# Patient Record
Sex: Male | Born: 1995 | Race: Black or African American | Hispanic: No | Marital: Single | Smoking: Current every day smoker
Health system: Southern US, Community
[De-identification: ages and names within clinical notes are randomized; demographics above are authoritative.]

## PROBLEM LIST (undated history)

## (undated) DIAGNOSIS — K4090 Unilateral inguinal hernia, without obstruction or gangrene, not specified as recurrent: Secondary | ICD-10-CM

## (undated) HISTORY — PX: MANDIBLE SURGERY: SHX707

---
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
## 2014-10-16 ENCOUNTER — Emergency Department (HOSPITAL_BASED_OUTPATIENT_CLINIC_OR_DEPARTMENT_OTHER)
Admission: EM | Admit: 2014-10-16 | Discharge: 2014-10-16 | Disposition: A | Discharge status: Home / Self Care | Payer: Medicaid Other | Attending: Emergency Medicine | Admitting: Emergency Medicine

## 2014-10-16 ENCOUNTER — Encounter (HOSPITAL_BASED_OUTPATIENT_CLINIC_OR_DEPARTMENT_OTHER): Payer: Self-pay

## 2014-10-16 DIAGNOSIS — Z720 Tobacco use: Secondary | ICD-10-CM | POA: Insufficient documentation

## 2014-10-16 DIAGNOSIS — R1909 Other intra-abdominal and pelvic swelling, mass and lump: Secondary | ICD-10-CM | POA: Diagnosis present

## 2014-10-16 DIAGNOSIS — K4090 Unilateral inguinal hernia, without obstruction or gangrene, not specified as recurrent: Secondary | ICD-10-CM | POA: Insufficient documentation

## 2014-10-16 HISTORY — DX: Unilateral inguinal hernia, without obstruction or gangrene, not specified as recurrent: K40.90

## 2014-10-16 MED ORDER — NAPROXEN 500 MG PO TABS: 500.0000 mg | ORAL_TABLET | Freq: Two times a day (BID) | ORAL | Status: DC

## 2014-10-16 NOTE — Discharge Instructions (Signed)

## 2014-10-16 NOTE — ED Provider Notes (Signed)
CSN: [id number]     Arrival date & time 10/16/14  1200 History   First MD Initiated Contact with Patient 10/16/14 1522     Chief Complaint  Patient presents with  . Hernia    HPI Patient presents to the emergency room complaining of left groin swelling. Patient has had what he believes to be a hernia in his left inguinal region for some period of time. He noticed yesterday though after work he was having increasing pain and swelling. Patient does notice that when he lifts things the swelling and discomfort increases. He denies any trouble with nausea or vomiting. Normal bowel movements. No trouble with appetite. No fever.  After my exam the patient's girlfriend also asked about a small rash that she noticed the proximal shaft of the patient's penis. She noticed it recently. Patient states it is not painful. It itches somewhat Past Medical History  Diagnosis Date  . Hernia, inguinal    Past Surgical History  Procedure Laterality Date  . Mandible surgery     Family History  Problem Relation Age of Onset  . Sudden death Neg Hx   . Heart attack Neg Hx    History  Substance Use Topics  . Smoking status: Current Every Day Smoker  . Smokeless tobacco: Not on file  . Alcohol Use: No    Review of Systems  All other systems reviewed and are negative.     Allergies  Review of patient's allergies indicates no known allergies.  Home Medications   Prior to Admission medications   Not on File   BP 117/68 mmHg  Pulse 67  Temp(Src) 98.7 F (37.1 C) (Oral)  Resp 18  Ht 5\' 10"  (1.778 m)  Wt 150 lb (68.04 kg)  BMI 21.52 kg/m2  SpO2 100% Physical Exam  Constitutional: He appears well-developed and well-nourished. No distress.  HENT:  Head: Normocephalic and atraumatic.  Right Ear: External ear normal.  Left Ear: External ear normal.  Eyes: Conjunctivae are normal. Right eye exhibits no discharge. Left eye exhibits no discharge. No scleral icterus.  Neck: Neck supple. No  tracheal deviation present.  Cardiovascular: Normal rate, regular rhythm and intact distal pulses.   Pulmonary/Chest: Effort normal and breath sounds normal. No stridor. No respiratory distress. He has no wheezes. He has no rales.  Abdominal: Soft. Bowel sounds are normal. He exhibits no distension. There is no tenderness. There is no rebound and no guarding. A hernia is present. Hernia confirmed positive in the left inguinal area. Hernia confirmed negative in the right inguinal area.  Genitourinary:    Right testis shows no swelling and no tenderness. Left testis shows no swelling and no tenderness.  Musculoskeletal: He exhibits no edema or tenderness.  Neurological: He is alert. He has normal strength. No cranial nerve deficit (no facial droop, extraocular movements intact, no slurred speech) or sensory deficit. He exhibits normal muscle tone. He displays no seizure activity. Coordination normal.  Skin: Skin is warm and dry. No rash noted.  Psychiatric: He has a normal mood and affect.  Nursing note and vitals reviewed.   ED Course  Procedures (including critical care time) Labs Review Labs Reviewed  HERPES SIMPLEX VIRUS CULTURE    Imaging Review No results found.   EKG Interpretation None      MDM   Final diagnoses:  Unilateral inguinal hernia without obstruction or gangrene, recurrence not specified   Patient has an inguinal hernia. He does not have any evidence of incarceration or strangulation. I  discussed my findings with him and I recommended consultation with a general surgeon. We discussed warning signs and precautions.  The incidental rash finding could be consistent with a herpes-type outbreak. Does not appear definitive for that to me so I will send off a culture for further analysis.    Linwood DibblesJon Saige Busby, MD 10/16/14 [phone]

## 2014-10-16 NOTE — ED Notes (Signed)
C/o left groin hernia-swelling noted since last night

## 2014-10-20 LAB — HERPES SIMPLEX VIRUS CULTURE
CULTURE: DETECTED
Special Requests: NORMAL

## 2014-10-21 ENCOUNTER — Telehealth (HOSPITAL_BASED_OUTPATIENT_CLINIC_OR_DEPARTMENT_OTHER): Payer: Self-pay | Admitting: Emergency Medicine

## 2014-10-21 NOTE — Progress Notes (Cosign Needed)
ED Antimicrobial Stewardship Positive Culture Follow Up   Julian Green is an 18 y.o. male [facility] with a chief complaint of  Chief Complaint  Patient presents with  . Hernia    Recent Results (from the past 720 hour(s))  Herpes simplex virus culture     Status: None   Collection Time: 10/16/14  3:00 PM  Result Value Ref Range Status   Specimen Description PENIS  Final   Special Requests Normal  Final   Culture   Final    Herpes Simplex Type 2 detected. Performed at [facility]    Report Status 10/20/2014 FINAL  Final    [x]  Patient discharged originally without antimicrobial agent and treatment is now indicated  New antibiotic prescription: valacylovir 1 g PO BID x10 days  ED Provider: Dahlia ClientHannah Muthersbaug, PA-C   Derwood KaplanShah, Olympia Adelsberger D, PharmD Candidate  Celedonio MiyamotoJeremy Frens, PharmD 10/21/2014, 9:59 AM Infectious Diseases Pharmacist Phone# [phone]

## 2014-10-21 NOTE — Telephone Encounter (Signed)
Post ED Visit - Positive Culture Follow-up: Successful Patient Follow-Up  Culture assessed and recommendations reviewed by: []  Wes Dulaney, Pharm.D., BCPS [x]  Celedonio MiyamotoJeremy Frens, [address] BCPS []  Georgina PillionElizabeth Martin, Pharm.D., BCPS []  Woodside EastMinh Pham, [address]Pharm[address]D., BCPS, AAHIVP []  Estella HuskMichelle Turner, Pharm.D., BCPS, AAHIVP []  Red ChristiansSamson Lee, Pharm.D. []  Tennis Mustassie Stewart, Pharm.D.  Positive HSV type 2  culture  [x]  Patient discharged without antimicrobial prescription and treatment is now indicated []  Organism is resistant to prescribed ED discharge antimicrobial []  Patient with positive blood cultures  Changes [facility] with ED provider[facility] New antibiotic prescription Valacyclovir 1 gram po bid x 10 days  Med not called in nor pt notified, left phone number for callback   [name], Lamarion Mcevers 10/21/2014, 3:30 PM

## 2014-10-21 NOTE — Telephone Encounter (Signed)
Post ED Visit - Positive Culture Follow-up: Successful Patient Follow-Up  Culture assessed and recommendations reviewed by: []  Julian Green, Pharm.D., BCPS [x]  Julian Green, [address]Pharm[address]D., BCPS []  Georgina PillionElizabeth Green, [address]Pharm[address] BCPS []  Julian Green, [address]Ph[address]., BCPS, AAHIVP []  Julian Green, Pharm.D., BCPS, AAHIVP []  Julian Green, Pharm.D. []  Julian Green, Pharm.D.  Positive HSV type 2 culture  [x]  Patient discharged without antimicrobial prescription and treatment is now indicated []  Organism is resistant to prescribed ED discharge antimicrobial []  Patient with positive blood cultures  Changes discussed with ED provider: Dierdre ForthHannah Muthersbaugh PA New antibiotic prescription Valacyclovir 1gram po bid x 10 days Called to [facility] [phone]  10/21/14 @ 1633   [name], [name] 10/21/2014, 4:41 PM

## 2014-10-29 NOTE — ED Notes (Signed)
Received telephone call from health department.  Pt wanting another prescription for Herpes medication.  Informed that pt would have to be re-evaluated by MD.

## 2017-01-26 ENCOUNTER — Encounter (HOSPITAL_BASED_OUTPATIENT_CLINIC_OR_DEPARTMENT_OTHER): Payer: Self-pay | Admitting: Emergency Medicine

## 2017-01-26 ENCOUNTER — Emergency Department (HOSPITAL_BASED_OUTPATIENT_CLINIC_OR_DEPARTMENT_OTHER)
Admission: EM | Admit: 2017-01-26 | Discharge: 2017-01-26 | Disposition: A | Discharge status: Home / Self Care | Payer: Medicaid Other | Attending: Emergency Medicine | Admitting: Emergency Medicine

## 2017-01-26 DIAGNOSIS — R1030 Lower abdominal pain, unspecified: Secondary | ICD-10-CM | POA: Insufficient documentation

## 2017-01-26 DIAGNOSIS — R197 Diarrhea, unspecified: Secondary | ICD-10-CM | POA: Insufficient documentation

## 2017-01-26 DIAGNOSIS — R112 Nausea with vomiting, unspecified: Secondary | ICD-10-CM

## 2017-01-26 DIAGNOSIS — F17200 Nicotine dependence, unspecified, uncomplicated: Secondary | ICD-10-CM | POA: Insufficient documentation

## 2017-01-26 MED ORDER — ONDANSETRON 4 MG PO TBDP
4.0000 mg | ORAL_TABLET | Freq: Once | ORAL | Status: AC
  Administered 2017-01-26: 4 mg via ORAL
  Filled 2017-01-26: qty 1

## 2017-01-26 MED ORDER — ONDANSETRON 4 MG PO TBDP: 4.0000 mg | ORAL_TABLET | Freq: Three times a day (TID) | ORAL | 0 refills | Status: DC | PRN

## 2017-01-26 NOTE — ED Triage Notes (Signed)
Patient states that he has had N/V/D since yesterday  - he also has generalized abdominal pain

## 2017-01-26 NOTE — ED Notes (Signed)
Pt given ginger ale to sip 

## 2017-01-26 NOTE — ED Notes (Signed)
Pt verbalizes understanding of d/c instructions and denies any further needs at this time. 

## 2017-01-26 NOTE — ED Provider Notes (Signed)
[facility]   CSN: [id number] Arrival date & time: 01/26/17  2119   By signing my name below, I, Nelwyn SalisburyJoshua Fowler, attest that this documentation has been prepared under the direction and in the presence of Benjiman CoreNathan Hollyanne Schloesser, MD . Electronically Signed: Nelwyn SalisburyJoshua Fowler, Scribe. 01/26/2017. 10:05 PM.   History   Chief Complaint Chief Complaint  Patient presents with  . Emesis   The history is provided by the patient. No language interpreter was used.    HPI Comments:  Julian Green is an otherwise healthy 21 y.o. male who presents to the Emergency Department complaining of sudden-onset, mild flu-like symptoms beginning yesterday. Pt is experiencing vomiting, diarrhea and abdominal pain. He describes his vomit as being exacerbated by eating, and primarily stomach contents and bile. Pt also reports having some sick contacts. Pt denies any fever or other symptoms.   Past Medical History:  Diagnosis Date  . Hernia, inguinal     Patient Active Problem List   Diagnosis Date Noted  . Sports physical 07/20/2012  . Inguinal hernia, left 07/20/2012    Past Surgical History:  Procedure Laterality Date  . MANDIBLE SURGERY         Home Medications    Prior to Admission medications   Medication Sig Start Date End Date Taking? Authorizing Provider  naproxen (NAPROSYN) 500 MG tablet Take 1 tablet (500 mg total) by mouth 2 (two) times daily. 10/16/14   Linwood DibblesJon Knapp, MD  ondansetron (ZOFRAN-ODT) 4 MG disintegrating tablet Take 1 tablet (4 mg total) by mouth every 8 (eight) hours as needed for nausea or vomiting. 01/26/17   Benjiman CoreNathan Alfhild Partch, MD    Family History Family History  Problem Relation Age of Onset  . Sudden death Neg Hx   . Heart attack Neg Hx     Social History Social History  Substance Use Topics  . Smoking status: Current Every Day Smoker  . Smokeless tobacco: Never Used  . Alcohol use No     Allergies   Patient has no known  allergies.   Review of Systems Review of Systems  Constitutional: Negative for fever.  Gastrointestinal: Positive for abdominal pain, diarrhea, nausea and vomiting.  All other systems reviewed and are negative.    Physical Exam Updated Vital Signs BP 105/72 (BP Location: Right Arm)   Pulse 72   Temp 98.3 F (36.8 C) (Oral)   Resp 18   Ht 5\' 10"  (1.778 m)   Wt 135 lb (61.2 kg)   SpO2 100%   BMI 19.37 kg/m   Physical Exam  Constitutional: He is oriented to person, place, and time. He appears well-developed and well-nourished. No distress.  HENT:  Head: Normocephalic and atraumatic.  Eyes: Conjunctivae are normal.  Cardiovascular: Normal rate.   Pulmonary/Chest: Effort normal.  Abdominal: He exhibits no distension. There is tenderness.  Mild suprapubic tenderness  Neurological: He is alert and oriented to person, place, and time.  Skin: Skin is warm and dry.  Psychiatric: He has a normal mood and affect.  Nursing note and vitals reviewed.  ED Treatments / Results   COORDINATION OF CARE:  10:08 PM Discussed treatment plan with pt at bedside which includes fluids and pt agreed to plan.  Labs (all labs ordered are listed, but only abnormal results are displayed) Labs Reviewed - No data to display  EKG  EKG Interpretation None       Radiology No results found.  Procedures Procedures (including critical care time)  Medications Ordered in  ED Medications  ondansetron (ZOFRAN-ODT) disintegrating tablet 4 mg (4 mg Oral Given 01/26/17 2217)     Initial Impression / Assessment and Plan / ED Course  I have reviewed the triage vital signs and the nursing notes.  Pertinent labs & imaging results that were available during my care of the patient were reviewed by me and considered in my medical decision making (see chart for details).     Patient presents with nausea vomiting diarrhea. Has had it for the last couple days. Dull abdominal pain. Well-appearing.  Discussed with patient and he preferred not have an IV. Zofran given in his nausea improved. Tolerated orals. Will discharge home.  Final Clinical Impressions(s) / ED Diagnoses   Final diagnoses:  Nausea vomiting and diarrhea    New Prescriptions New Prescriptions   ONDANSETRON (ZOFRAN-ODT) 4 MG DISINTEGRATING TABLET    Take 1 tablet (4 mg total) by mouth every 8 (eight) hours as needed for nausea or vomiting.     Benjiman Core, MD 01/26/17 [phone]

## 2017-01-26 NOTE — ED Notes (Signed)
C/o n/v/d onset Tuesday pm  Some abd pain  Has only vomited x 1 today and diarrhea x 2

## 2017-08-22 ENCOUNTER — Observation Stay (HOSPITAL_BASED_OUTPATIENT_CLINIC_OR_DEPARTMENT_OTHER)
Admission: EM | Admit: 2017-08-22 | Discharge: 2017-08-23 | Disposition: A | Discharge status: Home / Self Care | Payer: No Typology Code available for payment source | Attending: Physician Assistant | Admitting: Physician Assistant

## 2017-08-22 ENCOUNTER — Emergency Department (HOSPITAL_BASED_OUTPATIENT_CLINIC_OR_DEPARTMENT_OTHER): Payer: No Typology Code available for payment source

## 2017-08-22 ENCOUNTER — Encounter (HOSPITAL_BASED_OUTPATIENT_CLINIC_OR_DEPARTMENT_OTHER): Payer: Self-pay | Admitting: *Deleted

## 2017-08-22 DIAGNOSIS — S02609A Fracture of mandible, unspecified, initial encounter for closed fracture: Secondary | ICD-10-CM | POA: Diagnosis present

## 2017-08-22 DIAGNOSIS — F17210 Nicotine dependence, cigarettes, uncomplicated: Secondary | ICD-10-CM | POA: Diagnosis not present

## 2017-08-22 DIAGNOSIS — R1032 Left lower quadrant pain: Secondary | ICD-10-CM | POA: Diagnosis present

## 2017-08-22 DIAGNOSIS — R188 Other ascites: Secondary | ICD-10-CM

## 2017-08-22 DIAGNOSIS — S02651D Fracture of angle of right mandible, subsequent encounter for fracture with routine healing: Secondary | ICD-10-CM | POA: Insufficient documentation

## 2017-08-22 DIAGNOSIS — K4090 Unilateral inguinal hernia, without obstruction or gangrene, not specified as recurrent: Secondary | ICD-10-CM | POA: Diagnosis not present

## 2017-08-22 DIAGNOSIS — Z9889 Other specified postprocedural states: Secondary | ICD-10-CM | POA: Insufficient documentation

## 2017-08-22 LAB — CBC WITH DIFFERENTIAL/PLATELET
BASOS ABS: 0.1 10*3/uL (ref 0.0–0.1)
BASOS PCT: 1 %
Eosinophils Absolute: 0.1 10*3/uL (ref 0.0–0.7)
Eosinophils Relative: 3 %
HEMATOCRIT: 40.1 % (ref 39.0–52.0)
HEMOGLOBIN: 13.7 g/dL (ref 13.0–17.0)
LYMPHS PCT: 27 %
Lymphs Abs: 1.2 10*3/uL (ref 0.7–4.0)
MCH: 31.6 pg (ref 26.0–34.0)
MCHC: 34.2 g/dL (ref 30.0–36.0)
MCV: 92.4 fL (ref 78.0–100.0)
MONOS PCT: 8 %
Monocytes Absolute: 0.4 10*3/uL (ref 0.1–1.0)
NEUTROS ABS: 2.7 10*3/uL (ref 1.7–7.7)
NEUTROS PCT: 61 %
Platelets: 221 10*3/uL (ref 150–400)
RBC: 4.34 MIL/uL (ref 4.22–5.81)
RDW: 12.5 % (ref 11.5–15.5)
WBC: 4.4 10*3/uL (ref 4.0–10.5)

## 2017-08-22 LAB — BASIC METABOLIC PANEL
ANION GAP: 5 (ref 5–15)
BUN: 9 mg/dL (ref 6–20)
CHLORIDE: 102 mmol/L (ref 101–111)
CO2: 27 mmol/L (ref 22–32)
CREATININE: 0.94 mg/dL (ref 0.61–1.24)
Calcium: 9.5 mg/dL (ref 8.9–10.3)
GLUCOSE: 100 mg/dL — AB (ref 65–99)
Potassium: 4.2 mmol/L (ref 3.5–5.1)
Sodium: 134 mmol/L — ABNORMAL LOW (ref 135–145)

## 2017-08-22 MED ORDER — OXYCODONE HCL 5 MG PO TABS
10.0000 mg | ORAL_TABLET | ORAL | Status: DC | PRN
  Administered 2017-08-22 – 2017-08-23 (×4): 10 mg via ORAL
  Filled 2017-08-22 (×3): qty 2

## 2017-08-22 MED ORDER — SODIUM CHLORIDE 0.9 % IV SOLN
INTRAVENOUS | Status: DC
  Administered 2017-08-22: 20:00:00 via INTRAVENOUS

## 2017-08-22 MED ORDER — IOPAMIDOL (ISOVUE-300) INJECTION 61%
100.0000 mL | Freq: Once | INTRAVENOUS | Status: AC | PRN
  Administered 2017-08-22: 100 mL via INTRAVENOUS

## 2017-08-22 MED ORDER — FENTANYL CITRATE (PF) 100 MCG/2ML IJ SOLN
100.0000 ug | Freq: Once | INTRAMUSCULAR | Status: AC
  Administered 2017-08-22: 100 ug via INTRAVENOUS
  Filled 2017-08-22: qty 2

## 2017-08-22 MED ORDER — MORPHINE SULFATE (PF) 4 MG/ML IV SOLN
4.0000 mg | Freq: Once | INTRAVENOUS | Status: AC
  Administered 2017-08-22: 4 mg via INTRAVENOUS
  Filled 2017-08-22: qty 1

## 2017-08-22 MED ORDER — MORPHINE SULFATE (PF) 4 MG/ML IV SOLN
2.0000 mg | INTRAVENOUS | Status: DC | PRN
  Administered 2017-08-22 – 2017-08-23 (×4): 2 mg via INTRAVENOUS
  Filled 2017-08-22 (×4): qty 1

## 2017-08-22 MED ORDER — ACETAMINOPHEN 325 MG PO TABS: 650.0000 mg | ORAL_TABLET | ORAL | Status: DC | PRN

## 2017-08-22 MED ORDER — ONDANSETRON 4 MG PO TBDP
4.0000 mg | ORAL_TABLET | Freq: Four times a day (QID) | ORAL | Status: DC | PRN
Start: 2017-08-22 — End: 2017-08-23

## 2017-08-22 MED ORDER — OXYCODONE HCL 5 MG PO TABS
5.0000 mg | ORAL_TABLET | ORAL | Status: DC | PRN
  Filled 2017-08-22 (×2): qty 1

## 2017-08-22 MED ORDER — ONDANSETRON HCL 4 MG/2ML IJ SOLN: 4.0000 mg | Freq: Four times a day (QID) | INTRAMUSCULAR | Status: DC | PRN

## 2017-08-22 MED ORDER — FENTANYL CITRATE (PF) 100 MCG/2ML IJ SOLN
50.0000 ug | Freq: Once | INTRAMUSCULAR | Status: AC
  Administered 2017-08-22: 50 ug via INTRAVENOUS
  Filled 2017-08-22: qty 2

## 2017-08-22 MED ORDER — SODIUM CHLORIDE 0.9 % IV BOLUS (SEPSIS)
500.0000 mL | Freq: Once | INTRAVENOUS | Status: AC
  Administered 2017-08-22: 500 mL via INTRAVENOUS
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
## 2017-08-22 NOTE — ED Notes (Signed)
Pt family member enters room with 2 [employer] sandwiches and chips.  This RN reminded pt he was welcome to have clear liquids per MD orders.  Pt family member states "so he can't have [employer]?".

## 2017-08-22 NOTE — ED Notes (Addendum)
Pt repeatedly asking for food.  This RN reminded pt he could have clear liquids.  Pt given a sprite.

## 2017-08-22 NOTE — ED Notes (Addendum)
With patient's permission medical update given to his probation officer via phone
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
## 2017-08-23 LAB — BASIC METABOLIC PANEL
ANION GAP: 6 (ref 5–15)
BUN: 5 mg/dL — ABNORMAL LOW (ref 6–20)
CALCIUM: 9.3 mg/dL (ref 8.9–10.3)
CO2: 27 mmol/L (ref 22–32)
Chloride: 103 mmol/L (ref 101–111)
Creatinine, Ser: 1 mg/dL (ref 0.61–1.24)
GLUCOSE: 90 mg/dL (ref 65–99)
Potassium: 3.6 mmol/L (ref 3.5–5.1)
SODIUM: 136 mmol/L (ref 135–145)

## 2017-08-23 LAB — URINALYSIS, ROUTINE W REFLEX MICROSCOPIC
Bilirubin Urine: NEGATIVE
GLUCOSE, UA: NEGATIVE mg/dL
HGB URINE DIPSTICK: NEGATIVE
KETONES UR: NEGATIVE mg/dL
LEUKOCYTES UA: NEGATIVE
Nitrite: NEGATIVE
PROTEIN: NEGATIVE mg/dL
Specific Gravity, Urine: 1.009 (ref 1.005–1.030)
pH: 6 (ref 5.0–8.0)

## 2017-08-23 LAB — CBC
HCT: 40.4 % (ref 39.0–52.0)
Hemoglobin: 13.3 g/dL (ref 13.0–17.0)
MCH: 30.6 pg (ref 26.0–34.0)
MCHC: 32.9 g/dL (ref 30.0–36.0)
MCV: 93.1 fL (ref 78.0–100.0)
PLATELETS: 202 10*3/uL (ref 150–400)
RBC: 4.34 MIL/uL (ref 4.22–5.81)
RDW: 13 % (ref 11.5–15.5)
WBC: 5.7 10*3/uL (ref 4.0–10.5)

## 2017-08-23 LAB — HIV ANTIBODY (ROUTINE TESTING W REFLEX): HIV SCREEN 4TH GENERATION: NONREACTIVE

## 2017-08-23 MED ORDER — MORPHINE SULFATE (PF) 4 MG/ML IV SOLN: 2.0000 mg | INTRAVENOUS | Status: DC | PRN

## 2017-08-23 MED ORDER — ACETAMINOPHEN 325 MG PO TABS
650.0000 mg | ORAL_TABLET | Freq: Four times a day (QID) | ORAL | Status: DC
  Administered 2017-08-23 (×2): 650 mg via ORAL
  Filled 2017-08-23 (×2): qty 2

## 2017-08-23 MED ORDER — TRAMADOL HCL 50 MG PO TABS
50.0000 mg | ORAL_TABLET | Freq: Four times a day (QID) | ORAL | 0 refills | Status: DC | PRN
Start: 2017-08-23 — End: 2020-05-12

## 2017-08-23 MED ORDER — OXYCODONE HCL 10 MG PO TABS: 5.0000 mg | ORAL_TABLET | ORAL | 0 refills | Status: DC | PRN

## 2017-08-23 MED ORDER — TRAMADOL HCL 50 MG PO TABS
50.0000 mg | ORAL_TABLET | Freq: Four times a day (QID) | ORAL | Status: DC
  Administered 2017-08-23: 50 mg via ORAL
  Filled 2017-08-23: qty 1

## 2017-08-23 NOTE — Progress Notes (Signed)
Received pt per bed, alert and oriented. Pt complains of pain referred to as hernia. Oriented pt to room and use of call light.  Reminded pt of his diet and can only have clear liquids.
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
## 2017-08-23 NOTE — Discharge Instructions (Signed)
1. PAIN CONTROL:  1. Pain is best controlled by a usual combination of three different methods TOGETHER:  1. Ice/Heat 2. Over the counter pain medication 3. Prescription pain medication 2. Most patients will experience some swelling and bruising around wounds. Ice packs or heating pads (30-60 minutes up to 6 times a day) will help. Use ice for the first few days to help decrease swelling and bruising, then switch to heat to help relax tight/sore spots and speed recovery. Some people prefer to use ice alone, heat alone, alternating between ice & heat. Experiment to what works for you. Swelling and bruising can take several weeks to resolve.  3. It is helpful to take an over-the-counter pain medication regularly for the first few weeks. Choose one of the following that works best for you:  1. Naproxen (Aleve, etc) Two 220mg  tabs twice a day 2. Ibuprofen (Advil, etc) Three 200mg  tabs four times a day (every meal & bedtime) 3. Acetaminophen (Tylenol, etc) 500-650mg  four times a day (every meal & bedtime) 4. A prescription for pain medication (such as oxycodone, hydrocodone, etc) should be given to you upon discharge. Take your pain medication as prescribed.  1. If you are having problems/concerns with the prescription medicine (does not control pain, nausea, vomiting, rash, itching, etc), please call us [phone] to see if we need to switch you to a different pain medicine that will work better for you and/or control your side effect better. 2. If you need a refill on your pain medication, please contact your pharmacy. They will contact our office to request authorization. Prescriptions will not be filled after 5 pm or on week-ends. 4. Avoid getting constipated. When taking pain medications, it is common to experience some constipation. Increasing fluid intake and taking a fiber supplement (such as Metamucil, Citrucel, FiberCon, MiraLax, etc) 1-2 times a day regularly will usually help prevent this  problem from occurring. A mild laxative (prune juice, Milk of Magnesia, MiraLax, etc) should be taken according to package directions if there are no bowel movements after 48 hours.  5. Watch out for diarrhea. If you have many loose bowel movements, simplify your diet to bland foods & liquids for a few days. Stop any stool softeners and decrease your fiber supplement. Switching to mild anti-diarrheal medications (Kayopectate, Pepto Bismol) can help. If this worsens or does not improve, please call us. 6. Wash / shower every day. You may shower daily and replace your bandges after showering. No bathing or submerging your wounds in water until they heal. 7. FOLLOW UP in our office  1. Please call [facility] at [phone] to set up an appointment for a follow-up appointment approximately 2-3 weeks after discharge for wound check  WHEN TO CALL us [phone]:  1. Poor pain control 2. Reactions / problems with new medications (rash/itching, nausea, etc)  3. Fever over 101.5 F (38.5 C) 4. Worsening swelling or bruising 5. Continued bleeding from wounds. 6. Increased pain, redness, or drainage from the wounds which could be signs of infection  The clinic staff is available to answer your questions during regular business hours (8:30am-5pm). Please dont hesitate to call and ask to speak to one of our nurses for clinical concerns.  If you have a medical emergency, go to the nearest emergency room or call 911.  A surgeon from [facility] is always on call at the [facility], [place], [place], [place], [place] [address] ?  MAIN: [phone] ? TOLL FREE: [phone] ?  °FAX [phone]  °[email or web address] ° °
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
## 2017-08-23 NOTE — Progress Notes (Signed)
Patient discharged to home with instructions and prescriptions. 

## 2017-08-23 NOTE — Care Management Note (Signed)
Case Management Note  Patient Details  Name: Julian Green MRN: [id number] Date of Birth: 1996-03-15  Subjective/Objective:                    Action/Plan:  MATCH letter given and explained . Patient voiced understanding. Expected Discharge Date:  08/23/17               Expected Discharge Plan:  Home/Self Care  In-House Referral:  Clinical Social Work  Discharge planning Services     Post Acute Care Choice:    Choice offered to:     DME Arranged:    DME Agency:     HH Arranged:    HH Agency:     Status of Service:  Completed, signed off  If discussed at [employer], dates discussed:    Additional Comments:  Kingsley Plan, RN 08/23/2017, 3:24 PM

## 2017-09-11 ENCOUNTER — Telehealth (HOSPITAL_COMMUNITY): Payer: Self-pay

## 2017-09-11 NOTE — Telephone Encounter (Signed)
[phone]  Pt Julian Green needs to talk to someone about his appointment.  Please call.

## 2017-09-11 NOTE — Telephone Encounter (Signed)
Patient wanting telephone numbers for scheduling follow up appointments. Gave patient number for plastic surgery office and [facility] office. He will call plastic surgery office for follow up of mandible fracture. He will call [facility] office to discuss elective hernia repair.   Wells Guiles , [facility] 09/11/2017, 4:17 PM Pager: [phone] Mon-Fri 7:00 am-4:30 pm Sat-Sun 7:00 am-11:30 am
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
## 2020-05-12 ENCOUNTER — Encounter (HOSPITAL_BASED_OUTPATIENT_CLINIC_OR_DEPARTMENT_OTHER): Payer: Self-pay | Admitting: *Deleted

## 2020-05-12 ENCOUNTER — Emergency Department (HOSPITAL_BASED_OUTPATIENT_CLINIC_OR_DEPARTMENT_OTHER)
Admission: EM | Admit: 2020-05-12 | Discharge: 2020-05-12 | Disposition: A | Discharge status: Home / Self Care | Payer: Self-pay | Attending: Emergency Medicine | Admitting: Emergency Medicine

## 2020-05-12 ENCOUNTER — Other Ambulatory Visit: Payer: Self-pay

## 2020-05-12 DIAGNOSIS — R222 Localized swelling, mass and lump, trunk: Secondary | ICD-10-CM | POA: Insufficient documentation

## 2020-05-12 DIAGNOSIS — F1210 Cannabis abuse, uncomplicated: Secondary | ICD-10-CM | POA: Insufficient documentation

## 2020-05-12 DIAGNOSIS — F17210 Nicotine dependence, cigarettes, uncomplicated: Secondary | ICD-10-CM | POA: Insufficient documentation

## 2020-05-12 DIAGNOSIS — L309 Dermatitis, unspecified: Secondary | ICD-10-CM

## 2020-05-12 MED ORDER — PREDNISONE 10 MG PO TABS: 20.0000 mg | ORAL_TABLET | Freq: Two times a day (BID) | ORAL | 0 refills | Status: DC

## 2020-05-12 NOTE — ED Triage Notes (Signed)
Rashes all over his body for 2 weeks.
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
## 2020-05-12 NOTE — Discharge Instructions (Signed)
Begin taking prednisone as prescribed.  Take Benadryl 25 mg every 6 hours for the next 3 days.  This medication is available over-the-counter.  This medication may make you drowsy so be cautious with driving or operating machinery.  Follow-up with primary doctor if not improving in the next week, and return to the ER if symptoms significantly worsen or change.

## 2021-08-22 ENCOUNTER — Emergency Department (HOSPITAL_BASED_OUTPATIENT_CLINIC_OR_DEPARTMENT_OTHER)
Admission: EM | Admit: 2021-08-22 | Discharge: 2021-08-22 | Disposition: A | Discharge status: Home / Self Care | Payer: Medicaid Other | Attending: Emergency Medicine | Admitting: Emergency Medicine

## 2021-08-22 ENCOUNTER — Encounter (HOSPITAL_BASED_OUTPATIENT_CLINIC_OR_DEPARTMENT_OTHER): Payer: Self-pay | Admitting: *Deleted

## 2021-08-22 ENCOUNTER — Other Ambulatory Visit: Payer: Self-pay

## 2021-08-22 DIAGNOSIS — F17210 Nicotine dependence, cigarettes, uncomplicated: Secondary | ICD-10-CM | POA: Insufficient documentation

## 2021-08-22 DIAGNOSIS — K611 Rectal abscess: Secondary | ICD-10-CM

## 2021-08-22 DIAGNOSIS — K610 Anal abscess: Secondary | ICD-10-CM | POA: Insufficient documentation

## 2021-08-22 MED ORDER — AMOXICILLIN-POT CLAVULANATE 875-125 MG PO TABS: 1.0000 | ORAL_TABLET | Freq: Two times a day (BID) | ORAL | 0 refills | Status: DC

## 2021-08-22 MED ORDER — OXYCODONE HCL 5 MG PO TABS: 5.0000 mg | ORAL_TABLET | Freq: Four times a day (QID) | ORAL | 0 refills | Status: DC | PRN

## 2021-08-22 MED ORDER — AMOXICILLIN-POT CLAVULANATE 875-125 MG PO TABS
1.0000 | ORAL_TABLET | Freq: Once | ORAL | Status: AC
  Administered 2021-08-22: 1 via ORAL
  Filled 2021-08-22: qty 1

## 2021-08-22 MED ORDER — OXYCODONE HCL 5 MG PO TABS
5.0000 mg | ORAL_TABLET | Freq: Once | ORAL | Status: AC
  Administered 2021-08-22: 5 mg via ORAL
  Filled 2021-08-22: qty 1

## 2021-08-22 MED ORDER — LIDOCAINE-EPINEPHRINE (PF) 2 %-1:200000 IJ SOLN
10.0000 mL | Freq: Once | INTRAMUSCULAR | Status: AC
  Administered 2021-08-22: 10 mL
  Filled 2021-08-22: qty 20

## 2021-08-22 NOTE — ED Triage Notes (Signed)
Left groin pain for a week and ?abscess on his buttocks for 2 weeks.

## 2021-08-22 NOTE — ED Notes (Signed)
Patient left ED with ABCs intact, alert and oriented x4, respirations even and unlabored. Discharge instructions reviewed and all questions answered.   

## 2021-08-22 NOTE — Discharge Instructions (Signed)
Do warm soaks over the next few days.  Start the antibiotic.  He will have some drainage which is normal but then it should stop and the area should heal.
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
## 2022-11-26 ENCOUNTER — Encounter (HOSPITAL_BASED_OUTPATIENT_CLINIC_OR_DEPARTMENT_OTHER): Payer: Self-pay

## 2022-11-26 ENCOUNTER — Other Ambulatory Visit: Payer: Self-pay

## 2022-11-26 DIAGNOSIS — R110 Nausea: Secondary | ICD-10-CM | POA: Insufficient documentation

## 2022-11-26 DIAGNOSIS — Z1152 Encounter for screening for COVID-19: Secondary | ICD-10-CM | POA: Insufficient documentation

## 2022-11-26 DIAGNOSIS — R079 Chest pain, unspecified: Secondary | ICD-10-CM | POA: Insufficient documentation

## 2022-11-26 DIAGNOSIS — R059 Cough, unspecified: Secondary | ICD-10-CM | POA: Insufficient documentation

## 2022-11-26 DIAGNOSIS — Z5321 Procedure and treatment not carried out due to patient leaving prior to being seen by health care provider: Secondary | ICD-10-CM | POA: Insufficient documentation

## 2022-11-26 DIAGNOSIS — R519 Headache, unspecified: Secondary | ICD-10-CM | POA: Insufficient documentation

## 2022-11-26 LAB — RESP PANEL BY RT-PCR (RSV, FLU A&B, COVID)  RVPGX2
Influenza A by PCR: NEGATIVE
Influenza B by PCR: NEGATIVE
Resp Syncytial Virus by PCR: NEGATIVE
SARS Coronavirus 2 by RT PCR: NEGATIVE

## 2022-11-26 MED ORDER — ACETAMINOPHEN 325 MG PO TABS
650.0000 mg | ORAL_TABLET | Freq: Once | ORAL | Status: AC | PRN
  Administered 2022-11-26: 650 mg via ORAL
  Filled 2022-11-26: qty 2

## 2022-11-26 NOTE — ED Triage Notes (Signed)
2 days of CP and headache. Frontal headache that does not go away; pt has fever as high of 102. Daughter was sick at home. Pt endorses nausea, no diarrhea. Pt also with dry cough.

## 2022-11-27 ENCOUNTER — Emergency Department (HOSPITAL_BASED_OUTPATIENT_CLINIC_OR_DEPARTMENT_OTHER)
Admission: EM | Admit: 2022-11-27 | Discharge: 2022-11-27 | Discharge status: Against Medical Advice | Payer: Medicaid Other | Attending: Emergency Medicine | Admitting: Emergency Medicine

## 2023-06-17 ENCOUNTER — Encounter (HOSPITAL_BASED_OUTPATIENT_CLINIC_OR_DEPARTMENT_OTHER): Payer: Self-pay

## 2023-06-17 ENCOUNTER — Emergency Department (HOSPITAL_BASED_OUTPATIENT_CLINIC_OR_DEPARTMENT_OTHER)
Admission: EM | Admit: 2023-06-17 | Discharge: 2023-06-17 | Disposition: A | Discharge status: Home / Self Care | Payer: Self-pay | Attending: Emergency Medicine | Admitting: Emergency Medicine

## 2023-06-17 ENCOUNTER — Emergency Department (HOSPITAL_BASED_OUTPATIENT_CLINIC_OR_DEPARTMENT_OTHER): Payer: Self-pay

## 2023-06-17 ENCOUNTER — Other Ambulatory Visit: Payer: Self-pay

## 2023-06-17 DIAGNOSIS — R072 Precordial pain: Secondary | ICD-10-CM | POA: Insufficient documentation

## 2023-06-17 DIAGNOSIS — K4091 Unilateral inguinal hernia, without obstruction or gangrene, recurrent: Secondary | ICD-10-CM | POA: Insufficient documentation

## 2023-06-17 MED ORDER — KETOROLAC TROMETHAMINE 30 MG/ML IJ SOLN
30.0000 mg | Freq: Once | INTRAMUSCULAR | Status: AC
  Administered 2023-06-17: 30 mg via INTRAMUSCULAR
  Filled 2023-06-17: qty 1

## 2023-06-17 NOTE — ED Notes (Signed)
Patient transported to X-ray 

## 2023-06-17 NOTE — ED Provider Notes (Signed)
[facility] Provider Note   CSN: [id number] Arrival date & time: 06/17/23  4098     History  Chief Complaint  Patient presents with   Hernia   Chest Pain    Julian Green is a 27 y.o. male.  The history is provided by the patient.  Patient is otherwise healthy at baseline presents for 2 complaints:  Patient reports he has had persistent chest wall pain for 3 days.  He reports he has had intermittently before, but this episodes been ongoing for at least 3 days.  He reports it is constant, it does not resolve and appears to be worsened with movement and palpation.  No fevers or vomiting.  No cough.  No shortness of breath.  No syncope.  He has no known cardiac disease or previous VTE Patient does do heavy lifting at work which appears to trigger the pain.  Patient also reports he has had a hernia in his left groin for a long time.  He reports it is becoming more frequent, more painful at times it is numb.  No vomiting.  He reports normal bowel movements.   Past Medical History:  Diagnosis Date   Hernia, inguinal     Home Medications Prior to Admission medications   Not on File      Allergies    Patient has no known allergies.    Review of Systems   Review of Systems  Constitutional:  Negative for fever.  Respiratory:  Negative for shortness of breath.   Cardiovascular:  Positive for chest pain. Negative for leg swelling.  Gastrointestinal:  Negative for vomiting.  Genitourinary:  Negative for dysuria.  Neurological:  Negative for syncope.    Physical Exam Updated Vital Signs BP (!) 141/78   Pulse 69   Temp 98.6 F (37 C) (Oral)   Resp 18   Ht 1.778 m (5\' 10" )   Wt 70.3 kg   SpO2 100%   BMI 22.24 kg/m  Physical Exam CONSTITUTIONAL: Well developed/well nourished, no distress drinking coffee, using his phone HEAD: Normocephalic/atraumatic EYES: EOMI/PERRL ENMT: Mucous membranes moist NECK: supple no meningeal  signs SPINE/BACK:entire spine nontender CV: S1/S2 noted, no murmurs/rubs/gallops noted LUNGS: Lungs are clear to auscultation bilaterally, no apparent distress Chest-mild tenderness palpation noted to anterior chest, no bruising, no crepitus ABDOMEN: soft, nontender, no rebound or guarding, bowel sounds noted throughout abdomen GU: GU exam chaperoned by nurse Verdene Lennert descended bilaterally nontender  no penile lesions or discharge Left inguinal hernias noted that is easily reducible No overlying erythema or skin changes NEURO: Pt is awake/alert/appropriate, moves all extremitiesx4.  No facial droop.   EXTREMITIES: pulses normal/equal, full ROM, no calf tenderness SKIN: warm, color normal PSYCH: no abnormalities of mood noted, alert and oriented to situation  ED Results / Procedures / Treatments   Labs (all labs ordered are listed, but only abnormal results are displayed) Labs Reviewed - No data to display   EKG EKG Interpretation Date/Time:  Saturday June 17 2023 05:41:37 EDT Ventricular Rate:  57 PR Interval:  193 QRS Duration:  85 QT Interval:  392 QTC Calculation: 382 R Axis:   99  Text Interpretation: Sinus rhythm Borderline right axis deviation Nonspecific T abnrm, anterolateral leads T wave inversion Confirmed by Zadie Rhine ([id number]) on 06/17/2023 5:44:29 AM  Radiology DG Chest 2 View  Result Date: 06/17/2023 CLINICAL DATA:  27 year old male with history of chest pain. EXAM: CHEST - 2 VIEW COMPARISON:  No priors.  FINDINGS: Lung volumes are normal. No consolidative airspace disease. No pleural effusions. No pneumothorax. No pulmonary nodule or mass noted. Pulmonary vasculature and the cardiomediastinal silhouette are within normal limits. IMPRESSION: No radiographic evidence of acute cardiopulmonary disease. Electronically Signed   By: Trudie Reed M.D.   On: 06/17/2023 06:15    Procedures Procedures    Medications Ordered in ED Medications  ketorolac  (TORADOL) 30 MG/ML injection 30 mg (30 mg Intramuscular Given 06/17/23 0549)    ED Course/ Medical Decision Making/ A&P Clinical Course as of 06/17/23 1610  Sat Jun 17, 2023  0536 Patient reports he was driving to work when he decided to be evaluated for his chronic hernia as well as his ongoing chest pain.  Patient is in no acute distress.  Will obtain EKG and chest x-ray. [DW]  [phone] For his hernia, will refer to general surgery.  No signs of incarceration at this time. [DW]  [phone] Patient now reporting he feels that someone is sitting on his chest and is getting worse.  This was not mentioned on initial exam.  Patient reports he needs something stronger for pain as ibuprofen is not working.  Will proceed with further testing including troponin [DW]  0636 Patient declines any further testing.  When I reentered the room, patient was looking at his phone, no acute distress.  I advised that if he is having pain it feels like someone is sitting on his chest, we should explore further testing and a cardiac cause of his pain.  Patient prefers that he goes home and will monitor symptoms at home.  I advised against any narcotics at this time, but can continue to take ibuprofen at home.  We discussed strict ER return precautions [DW]    Clinical Course User Index [DW] Zadie Rhine, MD                             Medical Decision Making Amount and/or Complexity of Data Reviewed Labs: ordered. Radiology: ordered. ECG/medicine tests: ordered.  Risk Prescription drug management.   This patient presents to the ED for concern of chest pain, this involves an extensive number of treatment options, and is a complaint that carries with it a high risk of complications and morbidity.  The differential diagnosis includes but is not limited to acute coronary syndrome, aortic dissection, pulmonary embolism, pericarditis, pneumothorax, pneumonia, myocarditis, pleurisy, esophageal rupture   Social Determinants  of Health: Patient's lack of insurance and impaired access to primary care  increases the complexity of managing their presentation  Imaging Studies ordered: I ordered imaging studies including X-ray chest   I independently visualized and interpreted imaging which showed no acute findings I agree with the radiologist interpretation  Cardiac Monitoring: The patient was maintained on a cardiac monitor.  I personally viewed and interpreted the cardiac monitor which showed an underlying rhythm of:  sinus rhythm  Medicines ordered and prescription drug management: I ordered medication including Toradol for pain Reevaluation of the patient after these medicines showed that the patient    stayed the same  Test Considered: Patient is low risk / negative by [facility] score, therefore do not feel that cardiac workup or PE workup is indicated.   Reevaluation: After the interventions noted above, I reevaluated the patient and found that they have :stayed the same  Complexity of problems addressed: Patient's presentation is most consistent with  acute presentation with potential threat to life or bodily function  Final Clinical Impression(s) / ED Diagnoses Final diagnoses:  Precordial pain  Unilateral recurrent inguinal hernia without obstruction or gangrene    Rx / DC Orders ED Discharge Orders     None         Zadie Rhine, MD 06/17/23 [phone]

## 2023-06-17 NOTE — Discharge Instructions (Signed)

## 2023-06-17 NOTE — ED Triage Notes (Addendum)
Pt reports musculoskeletal central CP that is worse with certain movements. Pt also states he has 10/10 groin hernia pain. Pt thinks his hernia is growing and noticed numbness to the area. No discoloration.  No N/V/D, denies fevers. Denies any issues with urination.
